# Patient Record
Sex: Male | Born: 1965 | Race: Black or African American | Hispanic: No | Marital: Single | Smoking: Current every day smoker
Health system: Southern US, Community
[De-identification: ages and names within clinical notes are randomized; demographics above are authoritative.]

## PROBLEM LIST (undated history)

## (undated) DIAGNOSIS — I10 Essential (primary) hypertension: Secondary | ICD-10-CM

## (undated) DIAGNOSIS — E785 Hyperlipidemia, unspecified: Secondary | ICD-10-CM

## (undated) DIAGNOSIS — M109 Gout, unspecified: Secondary | ICD-10-CM

## (undated) HISTORY — DX: Hyperlipidemia, unspecified: E78.5

## (undated) HISTORY — PX: HAND SURGERY: SHX662

## (undated) HISTORY — DX: Gout, unspecified: M10.9

## (undated) HISTORY — PX: FOREIGN BODY REMOVAL: SHX962

## (undated) HISTORY — DX: Essential (primary) hypertension: I10

---

## 2003-03-21 ENCOUNTER — Emergency Department (HOSPITAL_COMMUNITY): Admission: EM | Admit: 2003-03-21 | Discharge: 2003-03-21 | Payer: Self-pay | Admitting: Emergency Medicine

## 2012-09-30 ENCOUNTER — Encounter (HOSPITAL_COMMUNITY): Payer: Self-pay | Admitting: Emergency Medicine

## 2012-09-30 ENCOUNTER — Emergency Department (HOSPITAL_COMMUNITY): Payer: Self-pay

## 2012-09-30 ENCOUNTER — Emergency Department (HOSPITAL_COMMUNITY)
Admission: EM | Admit: 2012-09-30 | Discharge: 2012-10-01 | Disposition: A | Discharge status: Home / Self Care | Payer: Self-pay | Attending: Emergency Medicine | Admitting: Emergency Medicine

## 2012-09-30 DIAGNOSIS — F17200 Nicotine dependence, unspecified, uncomplicated: Secondary | ICD-10-CM | POA: Insufficient documentation

## 2012-09-30 DIAGNOSIS — S21112A Laceration without foreign body of left front wall of thorax without penetration into thoracic cavity, initial encounter: Secondary | ICD-10-CM

## 2012-09-30 DIAGNOSIS — IMO0002 Reserved for concepts with insufficient information to code with codable children: Secondary | ICD-10-CM | POA: Insufficient documentation

## 2012-09-30 DIAGNOSIS — Z23 Encounter for immunization: Secondary | ICD-10-CM | POA: Insufficient documentation

## 2012-09-30 DIAGNOSIS — S21109A Unspecified open wound of unspecified front wall of thorax without penetration into thoracic cavity, initial encounter: Secondary | ICD-10-CM | POA: Insufficient documentation

## 2012-09-30 DIAGNOSIS — S80211A Abrasion, right knee, initial encounter: Secondary | ICD-10-CM

## 2012-09-30 DIAGNOSIS — W268XXA Contact with other sharp object(s), not elsewhere classified, initial encounter: Secondary | ICD-10-CM | POA: Insufficient documentation

## 2012-09-30 DIAGNOSIS — Y939 Activity, unspecified: Secondary | ICD-10-CM | POA: Insufficient documentation

## 2012-09-30 DIAGNOSIS — Y929 Unspecified place or not applicable: Secondary | ICD-10-CM | POA: Insufficient documentation

## 2012-09-30 LAB — BASIC METABOLIC PANEL
CO2: 24 mEq/L (ref 19–32)
Chloride: 98 mEq/L (ref 96–112)
Glucose, Bld: 118 mg/dL — ABNORMAL HIGH (ref 70–99)
Potassium: 3.1 mEq/L — ABNORMAL LOW (ref 3.5–5.1)
Sodium: 137 mEq/L (ref 135–145)

## 2012-09-30 LAB — CBC WITH DIFFERENTIAL/PLATELET
Eosinophils Relative: 2 % (ref 0–5)
Lymphocytes Relative: 51 % — ABNORMAL HIGH (ref 12–46)
Lymphs Abs: 2.8 10*3/uL (ref 0.7–4.0)
MCV: 87.2 fL (ref 78.0–100.0)
Neutrophils Relative %: 39 % — ABNORMAL LOW (ref 43–77)
Platelets: 204 10*3/uL (ref 150–400)
RBC: 4.37 MIL/uL (ref 4.22–5.81)
WBC: 5.4 10*3/uL (ref 4.0–10.5)

## 2012-09-30 MED ORDER — CLINDAMYCIN PHOSPHATE 600 MG/50ML IV SOLN: 600.0000 mg | Freq: Once | INTRAVENOUS | Status: DC

## 2012-09-30 MED ORDER — SODIUM CHLORIDE 0.9 % IV SOLN
INTRAVENOUS | Status: DC
  Administered 2012-09-30: 1000 mL via INTRAVENOUS

## 2012-09-30 MED ORDER — IOHEXOL 300 MG/ML  SOLN
100.0000 mL | Freq: Once | INTRAMUSCULAR | Status: AC | PRN
  Administered 2012-09-30: 100 mL via INTRAVENOUS
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
## 2012-10-01 ENCOUNTER — Emergency Department (HOSPITAL_COMMUNITY): Payer: Self-pay

## 2012-10-01 MED ORDER — TRAMADOL HCL 50 MG PO TABS
50.0000 mg | ORAL_TABLET | Freq: Four times a day (QID) | ORAL | Status: DC | PRN
Start: 2012-10-01 — End: 2015-03-06

## 2012-10-01 MED ORDER — TETANUS-DIPHTH-ACELL PERTUSSIS 5-2.5-18.5 LF-MCG/0.5 IM SUSP
0.5000 mL | Freq: Once | INTRAMUSCULAR | Status: AC
  Administered 2012-10-01: 0.5 mL via INTRAMUSCULAR
  Filled 2012-10-01 (×2): qty 0.5

## 2012-10-01 MED ORDER — MORPHINE SULFATE 4 MG/ML IJ SOLN
4.0000 mg | Freq: Once | INTRAMUSCULAR | Status: AC
  Administered 2012-10-01: 4 mg via INTRAVENOUS
  Filled 2012-10-01: qty 1
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
## 2012-10-01 NOTE — ED Notes (Signed)
sleeping
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
## 2012-10-01 NOTE — ED Notes (Signed)
Light gauze dressing placed on right leg abrasions
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
## 2015-03-06 ENCOUNTER — Ambulatory Visit: Payer: Self-pay | Admitting: Physician Assistant

## 2015-03-06 ENCOUNTER — Encounter: Payer: Self-pay | Admitting: Physician Assistant

## 2015-03-06 VITALS — BP 180/108 | HR 71 | Temp 97.7°F | Ht 67.75 in | Wt 169.4 lb

## 2015-03-06 DIAGNOSIS — Z131 Encounter for screening for diabetes mellitus: Secondary | ICD-10-CM

## 2015-03-06 DIAGNOSIS — I10 Essential (primary) hypertension: Secondary | ICD-10-CM

## 2015-03-06 DIAGNOSIS — F17210 Nicotine dependence, cigarettes, uncomplicated: Secondary | ICD-10-CM

## 2015-03-06 DIAGNOSIS — K0889 Other specified disorders of teeth and supporting structures: Secondary | ICD-10-CM

## 2015-03-06 DIAGNOSIS — Z125 Encounter for screening for malignant neoplasm of prostate: Secondary | ICD-10-CM

## 2015-03-06 LAB — CBC WITH DIFFERENTIAL/PLATELET
Basophils Absolute: 0 10*3/uL (ref 0.0–0.1)
Basophils Relative: 1 % (ref 0–1)
Eosinophils Absolute: 0.4 10*3/uL (ref 0.0–0.7)
Eosinophils Relative: 9 % — ABNORMAL HIGH (ref 0–5)
HEMATOCRIT: 36 % — AB (ref 39.0–52.0)
HEMOGLOBIN: 12.8 g/dL — AB (ref 13.0–17.0)
LYMPHS ABS: 1.6 10*3/uL (ref 0.7–4.0)
LYMPHS PCT: 37 % (ref 12–46)
MCH: 31.4 pg (ref 26.0–34.0)
MCHC: 35.6 g/dL (ref 30.0–36.0)
MCV: 88.5 fL (ref 78.0–100.0)
MONO ABS: 0.4 10*3/uL (ref 0.1–1.0)
MONOS PCT: 9 % (ref 3–12)
MPV: 11.1 fL (ref 8.6–12.4)
NEUTROS ABS: 1.8 10*3/uL (ref 1.7–7.7)
NEUTROS PCT: 44 % (ref 43–77)
Platelets: 210 10*3/uL (ref 150–400)
RBC: 4.07 MIL/uL — ABNORMAL LOW (ref 4.22–5.81)
RDW: 12.4 % (ref 11.5–15.5)
WBC: 4.2 10*3/uL (ref 4.0–10.5)

## 2015-03-06 LAB — COMPLETE METABOLIC PANEL WITH GFR
ALBUMIN: 4 g/dL (ref 3.6–5.1)
ALK PHOS: 50 U/L (ref 40–115)
ALT: 27 U/L (ref 9–46)
AST: 24 U/L (ref 10–35)
BILIRUBIN TOTAL: 0.3 mg/dL (ref 0.2–1.2)
BUN: 9 mg/dL (ref 7–25)
CALCIUM: 9.2 mg/dL (ref 8.6–10.3)
CO2: 26 mmol/L (ref 20–31)
CREATININE: 0.73 mg/dL (ref 0.70–1.33)
Chloride: 107 mmol/L (ref 98–110)
GFR, Est African American: >89 mL/min (ref >=60)
GLUCOSE: 97 mg/dL (ref 65–99)
Potassium: 3.6 mmol/L (ref 3.5–5.3)
SODIUM: 140 mmol/L (ref 135–146)
TOTAL PROTEIN: 7.1 g/dL (ref 6.1–8.1)

## 2015-03-06 LAB — TSH: TSH: 1.66 u[IU]/mL (ref 0.350–4.500)

## 2015-03-06 LAB — GLUCOSE, POCT (MANUAL RESULT ENTRY): POC GLUCOSE: 84 mg/dL (ref 70–99)

## 2015-03-06 MED ORDER — LISINOPRIL 10 MG PO TABS: 10.0000 mg | ORAL_TABLET | Freq: Every day | ORAL | Status: AC

## 2015-03-06 NOTE — Progress Notes (Signed)
BP 180/108 mmHg  Pulse 71  Temp(Src) 97.7 F (36.5 C)  Ht 5' 7.75" (1.721 m)  Wt 169 lb 6.4 oz (76.839 kg)  BMI 25.94 kg/m2  SpO2 99%   Subjective:    Patient ID: Terry Marshall, male    DOB: May 26, 1965, 50 y.o.   MRN: [id number]  HPI: Terry Marshall is a 50 y.o. male presenting on 03/06/2015 for New Patient (Initial Visit) and Dental Pain   HPI Pt states that only reason he became a pt of [facility] was to get to the dentist.  Told pt what his BP is today and he said it was because his tooth hurt.  He does reluctantly agree that he needs to be on medication for his BP.  Pt is currently not working.  He is living at a shelter in [place].  Relevant past medical, surgical, family and social history reviewed and updated as indicated. Interim medical history since our last visit reviewed. Allergies and medications reviewed and updated.  No current outpatient prescriptions on file.   Review of Systems  Constitutional: Negative for fever, chills, diaphoresis, appetite change, fatigue and unexpected weight change.  HENT: Positive for dental problem. Negative for congestion, drooling, ear pain, facial swelling, hearing loss, mouth sores, sneezing, sore throat, trouble swallowing and voice change.   Eyes: Negative for pain, discharge, redness, itching and visual disturbance.  Respiratory: Negative for cough, choking, shortness of breath and wheezing.   Cardiovascular: Negative for chest pain, palpitations and leg swelling.  Gastrointestinal: Negative for vomiting, abdominal pain, diarrhea, constipation and blood in stool.  Endocrine: Negative for cold intolerance, heat intolerance and polydipsia.  Genitourinary: Negative for dysuria, hematuria and decreased urine volume.  Musculoskeletal: Negative for back pain, arthralgias and gait problem.  Skin: Negative for rash.  Allergic/Immunologic: Negative for environmental allergies.  Neurological: Negative for seizures, syncope, light-headedness  and headaches.  Hematological: Negative for adenopathy.  Psychiatric/Behavioral: Negative for suicidal ideas, dysphoric mood and agitation. The patient is not nervous/anxious.     Per HPI unless specifically indicated above     Objective:    BP 180/108 mmHg  Pulse 71  Temp(Src) 97.7 F (36.5 C)  Ht 5' 7.75" (1.721 m)  Wt 169 lb 6.4 oz (76.839 kg)  BMI 25.94 kg/m2  SpO2 99%  Wt Readings from Last 3 Encounters:  03/06/15 169 lb 6.4 oz (76.839 kg)  09/30/12 140 lb (63.504 kg)    Physical Exam  Constitutional: He is oriented to person, place, and time. He appears well-developed and well-nourished.  HENT:  Head: Normocephalic and atraumatic.  Mouth/Throat: Uvula is midline, oropharynx is clear and moist and mucous membranes are normal. Dental caries (L upper) present. No dental abscesses. No oropharyngeal exudate.  Eyes: Conjunctivae and EOM are normal. Pupils are equal, round, and reactive to light.  Neck: Neck supple. No thyromegaly present.  Cardiovascular: Normal rate and regular rhythm.   Pulmonary/Chest: Effort normal and breath sounds normal. He has no wheezes. He has no rales.  Abdominal: Soft. Bowel sounds are normal. He exhibits no mass. There is no tenderness.  Musculoskeletal: He exhibits no edema.  Lymphadenopathy:    He has no cervical adenopathy.  Neurological: He is alert and oriented to person, place, and time.  Skin: Skin is warm and dry. No rash noted.  Psychiatric: He has a normal mood and affect. His behavior is normal. Thought content normal.  Vitals reviewed.   Results for orders placed or performed in visit on 03/06/15  POCT Glucose (CBG)  Result Value Ref Range   POC Glucose 84 70 - 99 mg/dl      Assessment & Plan:   Encounter Diagnoses  Name Primary?  . Essential hypertension, benign Yes  . Screening for diabetes mellitus   . Screening for prostate cancer   . Cigarette nicotine dependence, uncomplicated   . Pain, dental      -pt to get  baseline labs drawn when leaves office today.  He needs to have lipids drawn also but he has already eaten today and wants to get the blood drawn while he is in town -Rx lisinopril for bp -put on dental list -f/u 1 mo to recheck bp

## 2015-03-07 LAB — PSA: PSA: 0.94 ng/mL (ref <=4.00)

## 2015-04-03 ENCOUNTER — Ambulatory Visit: Payer: Self-pay | Admitting: Physician Assistant

## 2015-04-07 ENCOUNTER — Encounter: Payer: Self-pay | Admitting: Physician Assistant

## 2015-05-20 ENCOUNTER — Ambulatory Visit: Payer: Self-pay | Admitting: Physician Assistant

## 2015-05-22 ENCOUNTER — Encounter: Payer: Self-pay | Admitting: Physician Assistant
